# Patient Record
Sex: Female | Born: 1935 | Race: White | Hispanic: No | Smoking: Never smoker
Health system: Southern US, Community
[De-identification: ages and names within clinical notes are randomized; demographics above are authoritative.]

## PROBLEM LIST (undated history)

## (undated) DIAGNOSIS — M1990 Unspecified osteoarthritis, unspecified site: Secondary | ICD-10-CM

## (undated) DIAGNOSIS — G8929 Other chronic pain: Secondary | ICD-10-CM

## (undated) DIAGNOSIS — K5090 Crohn's disease, unspecified, without complications: Secondary | ICD-10-CM

## (undated) DIAGNOSIS — R32 Unspecified urinary incontinence: Secondary | ICD-10-CM

## (undated) DIAGNOSIS — Z5189 Encounter for other specified aftercare: Secondary | ICD-10-CM

## (undated) HISTORY — PX: TOTAL HIP ARTHROPLASTY: SHX124

## (undated) HISTORY — PX: JOINT REPLACEMENT: SHX530

## (undated) HISTORY — PX: APPENDECTOMY: SHX54

---

## 2013-02-21 ENCOUNTER — Emergency Department (HOSPITAL_BASED_OUTPATIENT_CLINIC_OR_DEPARTMENT_OTHER)
Admission: EM | Admit: 2013-02-21 | Discharge: 2013-02-22 | Disposition: A | Discharge status: Home / Self Care | Payer: Medicare Other | Attending: Emergency Medicine | Admitting: Emergency Medicine

## 2013-02-21 ENCOUNTER — Encounter (HOSPITAL_BASED_OUTPATIENT_CLINIC_OR_DEPARTMENT_OTHER): Payer: Self-pay | Admitting: *Deleted

## 2013-02-21 ENCOUNTER — Emergency Department (HOSPITAL_BASED_OUTPATIENT_CLINIC_OR_DEPARTMENT_OTHER): Payer: Medicare Other

## 2013-02-21 DIAGNOSIS — M19019 Primary osteoarthritis, unspecified shoulder: Secondary | ICD-10-CM | POA: Insufficient documentation

## 2013-02-21 DIAGNOSIS — M2550 Pain in unspecified joint: Secondary | ICD-10-CM | POA: Insufficient documentation

## 2013-02-21 DIAGNOSIS — Z87828 Personal history of other (healed) physical injury and trauma: Secondary | ICD-10-CM | POA: Insufficient documentation

## 2013-02-21 DIAGNOSIS — M8700 Idiopathic aseptic necrosis of unspecified bone: Secondary | ICD-10-CM | POA: Insufficient documentation

## 2013-02-21 DIAGNOSIS — M19011 Primary osteoarthritis, right shoulder: Secondary | ICD-10-CM

## 2013-02-21 HISTORY — DX: Other chronic pain: G89.29

## 2013-02-21 HISTORY — DX: Unspecified urinary incontinence: R32

## 2013-02-21 HISTORY — DX: Crohn's disease, unspecified, without complications: K50.90

## 2013-02-21 MED ORDER — KETOROLAC TROMETHAMINE 60 MG/2ML IM SOLN
60.0000 mg | Freq: Once | INTRAMUSCULAR | Status: AC
  Administered 2013-02-22: 60 mg via INTRAMUSCULAR
  Filled 2013-02-21: qty 2

## 2013-02-21 NOTE — ED Provider Notes (Signed)
History     This chart was scribed for Virat Prather Smitty Cords, MD, MD by Smitty Pluck, ED Scribe. The patient was seen in room MH04/MH04 and the patient's care was started at 11:27 PM.  CSN: [id number] Arrival date & time 02/21/13  2311    Chief Complaint  Patient presents with  . Arm Pain    Patient is a 79 y.o. female presenting with shoulder pain. The history is provided by the patient, medical records and a relative. No language interpreter was used.  Shoulder Pain This is a chronic problem. The current episode started more than 1 week ago. The problem occurs constantly. The problem has been rapidly worsening. Pertinent negatives include no chest pain, no abdominal pain, no headaches and no shortness of breath. Associated symptoms comments: Started while dressing when she moved the shoulder and then worse with using of the walker. Exacerbated by: movement and palpation of the left shoulder. Nothing relieves the symptoms. The treatment provided no relief.   HPI Comments: Deborah Butler is a 79 y.o. female who presents to the Emergency Department complaining of constant, moderate left shoulder pain onset tonight while dressing. Family reports that pt has hx of rotator cuff disease and swelling the left shoulder. Per family pt was putting on a shirt and the left shoulder pain started. Pt had Vicodin around 8 PM today without relief. Pt has eaten dinner. Pt denies fever, chills, nausea, vomiting, diarrhea, weakness, cough, SOB and any other pain. Per family pt uses walker for mobility and this also exacerbates the pain   Louis Matte is orthopedist    Past Medical History  Diagnosis Date  . Chronic pain    No past surgical history on file. No family history on file. History  Substance Use Topics  . Smoking status: Not on file  . Smokeless tobacco: Not on file  . Alcohol Use: Not on file   OB History   Grav Para Term Preterm Abortions TAB SAB Ect Mult Living                  Review of Systems  Constitutional: Negative for fever and chills.  Respiratory: Negative for shortness of breath.   Cardiovascular: Negative for chest pain.  Gastrointestinal: Negative for nausea, vomiting and abdominal pain.  Musculoskeletal: Positive for arthralgias.  Neurological: Negative for weakness and headaches.  All other systems reviewed and are negative.    Allergies  Morphine and related  Home Medications  No current outpatient prescriptions on file. There were no vitals taken for this visit.  Physical Exam  Nursing note and vitals reviewed. Constitutional: She is oriented to person, place, and time. She appears well-developed and well-nourished. No distress.  HENT:  Head: Normocephalic and atraumatic.  Eyes: EOM are normal. Pupils are equal, round, and reactive to light.  Neck: Normal range of motion. Neck supple. No tracheal deviation present.  Cardiovascular: Normal rate, regular rhythm and normal heart sounds.   Pulmonary/Chest: Effort normal. No respiratory distress. She has no wheezes.  Abdominal: Soft. Bowel sounds are normal. She exhibits no distension. There is no tenderness. There is no rebound and no guarding.  Musculoskeletal:  Intact radial pulse in the left wrist  Cap refill less than 2 seconds in left hand Left biceps tendon intact  Left    Neurological: She is alert and oriented to person, place, and time.  Skin: Skin is warm and dry.  Psychiatric: She has a normal mood and affect. Her behavior is normal.  ED Course  Procedures (including critical care time) DIAGNOSTIC STUDIES:   COORDINATION OF CARE: 11:34 PM Discussed ED treatment with pt and pt agrees.     Labs Reviewed - No data to display No results found. No diagnosis found.  MDM  Family requests inpatient care for pain management placed 3 calls via Dr. Rolly Salter answering service  Dr. Willette Cluster on call for Dr. Thamas Jaegers returned call, follow up in office no indication for  admission at this time  I personally performed the services described in this documentation, which was scribed in my presence. The recorded information has been reviewed and is accurate.    Jasmine Awe, MD 02/22/13 [id number]

## 2013-02-21 NOTE — ED Notes (Signed)
EMS transport from home- per ems report: chronic pain- c/o increased left shoulder pain after attempting to put on a shirt- pt reports she took a vicodin at 2100

## 2013-02-22 ENCOUNTER — Encounter (HOSPITAL_BASED_OUTPATIENT_CLINIC_OR_DEPARTMENT_OTHER): Payer: Self-pay | Admitting: Emergency Medicine

## 2013-02-22 MED ORDER — FENTANYL CITRATE 0.05 MG/ML IJ SOLN
INTRAMUSCULAR | Status: AC
  Administered 2013-02-22: 25 ug
  Filled 2013-02-22: qty 2

## 2013-02-22 MED ORDER — LIDOCAINE 5 % EX PTCH: 1.0000 | MEDICATED_PATCH | CUTANEOUS | Status: AC

## 2013-02-22 MED ORDER — LIP MEDEX EX OINT
TOPICAL_OINTMENT | CUTANEOUS | Status: AC
  Administered 2013-02-22: 01:00:00
  Filled 2013-02-22: qty 7

## 2013-02-22 MED ORDER — FENTANYL CITRATE 0.05 MG/ML IJ SOLN
25.0000 ug | Freq: Once | INTRAMUSCULAR | Status: AC
Start: 2013-02-22 — End: 2013-02-22
  Administered 2013-02-22: 25 ug via INTRAVENOUS

## 2013-02-22 MED ORDER — FENTANYL CITRATE 0.05 MG/ML IJ SOLN
INTRAMUSCULAR | Status: AC
  Administered 2013-02-22: 01:00:00
  Filled 2013-02-22: qty 2

## 2013-02-22 MED ORDER — OXYCODONE-ACETAMINOPHEN 5-325 MG PO TABS: 1.0000 | ORAL_TABLET | Freq: Four times a day (QID) | ORAL | Status: AC | PRN

## 2013-02-22 NOTE — ED Notes (Signed)
Pt incontinent urine bed linen and adult diaper changed family members at bedside call to [facility] ortho service related to ortho consult and intractable Left shoulder pain. Waiting return call at this time.

## 2013-07-17 ENCOUNTER — Emergency Department (HOSPITAL_BASED_OUTPATIENT_CLINIC_OR_DEPARTMENT_OTHER)
Admission: EM | Admit: 2013-07-17 | Discharge: 2013-07-17 | Disposition: A | Discharge status: Home / Self Care | Payer: Medicare Other | Attending: Emergency Medicine | Admitting: Emergency Medicine

## 2013-07-17 ENCOUNTER — Emergency Department (HOSPITAL_BASED_OUTPATIENT_CLINIC_OR_DEPARTMENT_OTHER): Payer: Medicare Other

## 2013-07-17 ENCOUNTER — Encounter (HOSPITAL_BASED_OUTPATIENT_CLINIC_OR_DEPARTMENT_OTHER): Payer: Self-pay | Admitting: Emergency Medicine

## 2013-07-17 DIAGNOSIS — Y9289 Other specified places as the place of occurrence of the external cause: Secondary | ICD-10-CM | POA: Insufficient documentation

## 2013-07-17 DIAGNOSIS — Z7982 Long term (current) use of aspirin: Secondary | ICD-10-CM | POA: Insufficient documentation

## 2013-07-17 DIAGNOSIS — Y939 Activity, unspecified: Secondary | ICD-10-CM | POA: Insufficient documentation

## 2013-07-17 DIAGNOSIS — K5090 Crohn's disease, unspecified, without complications: Secondary | ICD-10-CM | POA: Insufficient documentation

## 2013-07-17 DIAGNOSIS — Z79899 Other long term (current) drug therapy: Secondary | ICD-10-CM | POA: Insufficient documentation

## 2013-07-17 DIAGNOSIS — G8929 Other chronic pain: Secondary | ICD-10-CM | POA: Insufficient documentation

## 2013-07-17 DIAGNOSIS — M19019 Primary osteoarthritis, unspecified shoulder: Secondary | ICD-10-CM | POA: Insufficient documentation

## 2013-07-17 DIAGNOSIS — X500XXA Overexertion from strenuous movement or load, initial encounter: Secondary | ICD-10-CM | POA: Insufficient documentation

## 2013-07-17 HISTORY — DX: Unspecified osteoarthritis, unspecified site: M19.90

## 2013-07-17 HISTORY — DX: Encounter for other specified aftercare: Z51.89

## 2013-07-17 MED ORDER — FENTANYL CITRATE 0.05 MG/ML IJ SOLN
50.0000 ug | Freq: Once | INTRAMUSCULAR | Status: AC
  Administered 2013-07-17: 50 ug via NASAL
  Filled 2013-07-17: qty 2

## 2013-07-17 MED ORDER — FENTANYL CITRATE 0.05 MG/ML IJ SOLN: 50.0000 ug | Freq: Once | INTRAMUSCULAR | Status: DC

## 2013-07-17 NOTE — ED Notes (Signed)
Pt verbalized pain relief s/p first Fentanyl administration.  Son has been advocating for patient to receive another dose of Fentanyl prior discharge.  However, given patient's age, weight, and level of comfort, if discharge is planned, I don't feel comfortable administering the second dose without further monitoring.  Dr. Bernette Mayers and I discussed this, I discussed this with the son, and we will plan discharge home now and plan for her to take pain medications at home.

## 2013-07-17 NOTE — ED Notes (Signed)
Pt reports she moved her right shoulder and it "popped" and it now very painful- family gave her a hydrocodone at 5pm

## 2013-07-17 NOTE — ED Notes (Signed)
Dr Sheldon at bedside  

## 2013-07-17 NOTE — ED Provider Notes (Signed)
CSN: [id number]     Arrival date & time 07/17/13  1749 History  This chart was scribed for Adriyanna Christians B. Bernette Mayers, MD by Clydene Laming, ED Scribe. This patient was seen in room MH05/MH05 and the patient's care was started at 6:10 PM.   Chief Complaint  Patient presents with  . Shoulder Pain    The history is provided by the patient and a relative. No language interpreter was used.  HPI Comments: Deborah Butler is a 79 y.o. female who presents to the Emergency Department complaining of right shoulder pain. Pt was in the bathroom today when she raised her right arm and heard a "pop".  She states she cannot move the right arm. Her family gave her hydrocodone and applied pain gel to the area. She has a hx of arthritis. Pt has her quarterly appointment this upcoming Monday with an orthopedist to receive her quarterly steroid injection. Pt denies numbness.   Past Medical History  Diagnosis Date  . Chronic pain   . Crohn disease   . Urinary incontinence   . Arthritis   . Blood transfusion without reported diagnosis    Past Surgical History  Procedure Laterality Date  . Joint replacement    . Total hip arthroplasty    . Appendectomy     No family history on file. History  Substance Use Topics  . Smoking status: Never Smoker   . Smokeless tobacco: Never Used  . Alcohol Use: No   OB History   Grav Para Term Preterm Abortions TAB SAB Ect Mult Living                 Review of Systems  Musculoskeletal: Positive for arthralgias.  All other systems reviewed and are negative.    Allergies  Morphine and related  Home Medications   Current Outpatient Rx  Name  Route  Sig  Dispense  Refill  . acetaminophen (TYLENOL) 500 MG tablet   Oral   Take 500 mg by mouth every 6 (six) hours as needed for pain (2 tabs every 8 hrs as needed).         Marland Kitchen aspirin 81 MG tablet   Oral   Take 81 mg by mouth daily.         . calcium carbonate (TUMS - DOSED IN MG ELEMENTAL CALCIUM) 500 MG chewable  tablet   Oral   Chew 1 tablet by mouth daily.         . cloNIDine (CATAPRES) 0.1 MG tablet   Oral   Take 0.1 mg by mouth 2 (two) times daily.         . furosemide (LASIX) 40 MG tablet   Oral   Take 40 mg by mouth daily.         Marland Kitchen gabapentin (NEURONTIN) 600 MG tablet   Oral   Take 600 mg by mouth 2 (two) times daily.         . hydrochlorothiazide (HYDRODIURIL) 12.5 MG tablet   Oral   Take 12.5 mg by mouth daily.         Marland Kitchen HYDROcodone-acetaminophen (NORCO/VICODIN) 5-325 MG per tablet   Oral   Take 1 tablet by mouth every 6 (six) hours as needed for moderate pain.         . Mesalamine (DELZICOL PO)   Oral   Take 800 mg by mouth 3 (three) times daily.         Marland Kitchen oxyCODONE-acetaminophen (PERCOCET) 5-325 MG per tablet   Oral  Take 1 tablet by mouth every 6 (six) hours as needed for pain.   10 tablet   0   . potassium chloride (K-DUR) 10 MEQ tablet   Oral   Take 10 mEq by mouth 2 (two) times daily.         . ramipril (ALTACE) 10 MG tablet   Oral   Take 10 mg by mouth daily.         . traMADol (ULTRAM) 50 MG tablet   Oral   Take 50 mg by mouth every 6 (six) hours as needed for pain (1 - 2 tabs every 4-6 hrs).         . lidocaine (LIDODERM) 5 %   Transdermal   Place 1 patch onto the skin daily. Remove & Discard patch within 12 hours or as directed by MD   6 patch   0   . Mirabegron (MYRBETRIQ PO)   Oral   Take 25 mg by mouth 3 (three) times a week.          Triage Vitals:BP 200/82  Pulse 78  Temp(Src) 97.8 F (36.6 C) (Oral)  Resp 18  Ht 5' (1.524 m)  Wt 118 lb (53.524 kg)  BMI 23.05 kg/m2  SpO2 98% Physical Exam  Nursing note and vitals reviewed. Constitutional: She is oriented to person, place, and time. She appears well-developed and well-nourished.  HENT:  Head: Normocephalic and atraumatic.  Eyes: EOM are normal. Pupils are equal, round, and reactive to light.  Neck: Normal range of motion. Neck supple.  Cardiovascular: Normal  rate, normal heart sounds and intact distal pulses.   Pulmonary/Chest: Effort normal and breath sounds normal.  Abdominal: Bowel sounds are normal. She exhibits no distension. There is no tenderness.  Musculoskeletal: She exhibits tenderness (right shoulder). She exhibits no edema.  rom limited due to pain neurovascular intact   Neurological: She is alert and oriented to person, place, and time. She has normal strength. No cranial nerve deficit or sensory deficit.  Skin: Skin is warm and dry. No rash noted.  Psychiatric: She has a normal mood and affect.    ED Course  Procedures (including critical care time) DIAGNOSTIC STUDIES: Oxygen Saturation is 98% on RA, normal by my interpretation.    COORDINATION OF CARE: 6:12 PM- Discussed treatment plan with pt at bedside. Pt verbalized understanding and agreement with plan.   Labs Review Labs Reviewed - No data to display Imaging Review No results found.  EKG Interpretation   None       MDM   1. Osteoarthritis, shoulder, right     Intranasal fentanyl given with minimal improvement. Will place in sling she brought from home and reposition arm. Plan an additional dose of fentanyl if needed. She has pain medications she can take at home and Ortho appt already scheduled in 2 days.   I personally performed the services described in this documentation, which was scribed in my presence. The recorded information has been reviewed and is accurate.       Hermilo Dutter B. Bernette Mayers, MD 07/17/13 1944

## 2013-10-10 ENCOUNTER — Emergency Department (HOSPITAL_BASED_OUTPATIENT_CLINIC_OR_DEPARTMENT_OTHER)
Admission: EM | Admit: 2013-10-10 | Discharge: 2013-10-10 | Disposition: A | Discharge status: Home / Self Care | Payer: Medicare Other | Attending: Emergency Medicine | Admitting: Emergency Medicine

## 2013-10-10 ENCOUNTER — Encounter (HOSPITAL_BASED_OUTPATIENT_CLINIC_OR_DEPARTMENT_OTHER): Payer: Self-pay | Admitting: Emergency Medicine

## 2013-10-10 ENCOUNTER — Emergency Department (HOSPITAL_BASED_OUTPATIENT_CLINIC_OR_DEPARTMENT_OTHER): Payer: Medicare Other

## 2013-10-10 DIAGNOSIS — M79609 Pain in unspecified limb: Secondary | ICD-10-CM | POA: Insufficient documentation

## 2013-10-10 DIAGNOSIS — Z87448 Personal history of other diseases of urinary system: Secondary | ICD-10-CM | POA: Insufficient documentation

## 2013-10-10 DIAGNOSIS — Z79899 Other long term (current) drug therapy: Secondary | ICD-10-CM | POA: Insufficient documentation

## 2013-10-10 DIAGNOSIS — M129 Arthropathy, unspecified: Secondary | ICD-10-CM | POA: Insufficient documentation

## 2013-10-10 DIAGNOSIS — Z8719 Personal history of other diseases of the digestive system: Secondary | ICD-10-CM | POA: Insufficient documentation

## 2013-10-10 DIAGNOSIS — M25511 Pain in right shoulder: Secondary | ICD-10-CM

## 2013-10-10 DIAGNOSIS — M25519 Pain in unspecified shoulder: Secondary | ICD-10-CM | POA: Insufficient documentation

## 2013-10-10 DIAGNOSIS — Z7982 Long term (current) use of aspirin: Secondary | ICD-10-CM | POA: Insufficient documentation

## 2013-10-10 DIAGNOSIS — R011 Cardiac murmur, unspecified: Secondary | ICD-10-CM | POA: Insufficient documentation

## 2013-10-10 DIAGNOSIS — G8929 Other chronic pain: Secondary | ICD-10-CM | POA: Insufficient documentation

## 2013-10-10 MED ORDER — FENTANYL CITRATE 0.05 MG/ML IJ SOLN
50.0000 ug | Freq: Once | INTRAMUSCULAR | Status: AC
  Administered 2013-10-10: 50 ug via NASAL
  Filled 2013-10-10: qty 2

## 2013-10-10 NOTE — ED Notes (Signed)
Patient routinely goes for steroid injections for ' both shoulders are mush' per patient's son.  Has a lot of chronic orthopedic pain.  Reports right arm/shoulder pain today.  Vicodin is not controlling her pain.  Denies fall, recent injury.

## 2013-10-10 NOTE — ED Provider Notes (Signed)
CSN: [id number]     Arrival date & time 10/10/13  1428 History   First MD Initiated Contact with Patient 10/10/13 1751     Chief Complaint  Patient presents with  . Arm Pain     (Consider location/radiation/quality/duration/timing/severity/associated sxs/prior Treatment) Patient is a 80 y.o. female presenting with arm pain. The history is provided by a relative.  Arm Pain This is a chronic problem. The problem occurs constantly.   Deborah Butler is a 80 y.o. female who presents to the ED with her son for bilateral chronic right shoulder and arm pain. She is taking Vicodin for pain without relief. She goes for steroid shots in the shoulder. Denies recent falls or injury to the arm. However, her son states that she was in the bathroom and when she came out was complaining about severe pain int he right shoulder and upper arm. Patient lives in her own home but has someone with her 24/7. It is almost time for her to go the the orthopedic doctor for her steroid injection and occasionally the pain will return before time to go.   Past Medical History  Diagnosis Date  . Chronic pain   . Crohn disease   . Urinary incontinence   . Arthritis   . Blood transfusion without reported diagnosis    Past Surgical History  Procedure Laterality Date  . Joint replacement    . Total hip arthroplasty    . Appendectomy     No family history on file. History  Substance Use Topics  . Smoking status: Never Smoker   . Smokeless tobacco: Never Used  . Alcohol Use: No   OB History   Grav Para Term Preterm Abortions TAB SAB Ect Mult Living                 Review of Systems Negative except as stated in HPI   Allergies  Morphine and related  Home Medications   Current Outpatient Rx  Name  Route  Sig  Dispense  Refill  . acetaminophen (TYLENOL) 500 MG tablet   Oral   Take 500 mg by mouth every 6 (six) hours as needed for pain (2 tabs every 8 hrs as needed).         Marland Kitchen aspirin 81 MG tablet  Oral   Take 81 mg by mouth daily.         . calcium carbonate (TUMS - DOSED IN MG ELEMENTAL CALCIUM) 500 MG chewable tablet   Oral   Chew 1 tablet by mouth daily.         . cloNIDine (CATAPRES) 0.1 MG tablet   Oral   Take 0.1 mg by mouth 2 (two) times daily.         . furosemide (LASIX) 40 MG tablet   Oral   Take 40 mg by mouth daily.         Marland Kitchen gabapentin (NEURONTIN) 600 MG tablet   Oral   Take 600 mg by mouth 2 (two) times daily.         . hydrochlorothiazide (HYDRODIURIL) 12.5 MG tablet   Oral   Take 12.5 mg by mouth daily.         Marland Kitchen HYDROcodone-acetaminophen (NORCO/VICODIN) 5-325 MG per tablet   Oral   Take 1 tablet by mouth every 6 (six) hours as needed for moderate pain.         Marland Kitchen lidocaine (LIDODERM) 5 %   Transdermal   Place 1 patch onto the skin daily.  Remove & Discard patch within 12 hours or as directed by MD   6 patch   0   . Mesalamine (DELZICOL PO)   Oral   Take 800 mg by mouth 3 (three) times daily.         . Mirabegron (MYRBETRIQ PO)   Oral   Take 25 mg by mouth 3 (three) times a week.         Marland Kitchen. oxyCODONE-acetaminophen (PERCOCET) 5-325 MG per tablet   Oral   Take 1 tablet by mouth every 6 (six) hours as needed for pain.   10 tablet   0   . potassium chloride (K-DUR) 10 MEQ tablet   Oral   Take 10 mEq by mouth 2 (two) times daily.         . ramipril (ALTACE) 10 MG tablet   Oral   Take 10 mg by mouth daily.         . traMADol (ULTRAM) 50 MG tablet   Oral   Take 50 mg by mouth every 6 (six) hours as needed for pain (1 - 2 tabs every 4-6 hrs).          BP 184/94  Pulse 76  Temp(Src) 97.7 F (36.5 C) (Oral)  Resp 18  Ht 5' (1.524 m)  Wt 115 lb (52.164 kg)  BMI 22.46 kg/m2  SpO2 95% Physical Exam  Nursing note and vitals reviewed. Constitutional: She is oriented to person, place, and time. She appears well-developed and well-nourished. No distress.  HENT:  Head: Normocephalic and atraumatic.  Eyes: EOM are  normal.  Neck: Normal range of motion. Neck supple.  Cardiovascular: Normal rate.   Murmur heard. Pulmonary/Chest: Effort normal. She has no wheezes.  Abdominal: Soft. There is no tenderness.  Musculoskeletal:       Right shoulder: She exhibits decreased range of motion, tenderness and crepitus. She exhibits no deformity, no laceration, normal pulse and normal strength.  Pain with palpation anterior aspect of the right shoulder with pain radiating to the upper arm. Radial pulses equal, adequate circulation, good touch sensation.   Neurological: She is alert and oriented to person, place, and time. No cranial nerve deficit.  Skin: Skin is warm and dry.  Psychiatric: She has a normal mood and affect. Her behavior is normal.   Dg Shoulder Right  10/10/2013   CLINICAL DATA:  Chronic shoulder pain.  EXAM: RIGHT SHOULDER - 2+ VIEW  COMPARISON:  DG SHOULDER *R* dated 07/17/2013  FINDINGS: Chronic destructive changes appreciated involving the right shoulder. There is no evidence of acute osseous abnormalities. When compared to previous study these findings have increased in severity. Stable chronic changes within distal clavicle. The bones are osteopenic.  IMPRESSION: Chronic advanced osteoarthritic changes involving the right shoulder. No evidence of acute osseous abnormalities. Stable chronic changes within the distal clavicle.   Electronically Signed   By: Salome HolmesHector  Cooper M.D.   On: 10/10/2013 19:32    ED Course: Dr. Deretha EmoryZackowski in to examine the patient.  Procedures MDM   Gave patient nasal Fentanyl 50 mcg and she is pain free. She continues to be alert. I have reviewed this patient's vital signs, nurses notes, appropriate labs and imaging.  I have discussed findings and plan of care with the patient's son and he voices understanding.        Connelly SpringsHope M Cherise Fedder, [place]NP 10/11/13 2136

## 2013-10-10 NOTE — ED Provider Notes (Signed)
Medical screening examination/treatment/procedure(s) were conducted as a shared visit with non-physician practitioner(s) and myself.  I personally evaluated the patient during the encounter.  EKG Interpretation   None      No results found for this or any previous visit. Dg Shoulder Right  10/10/2013   CLINICAL DATA:  Chronic shoulder pain.  EXAM: RIGHT SHOULDER - 2+ VIEW  COMPARISON:  DG SHOULDER *R* dated 07/17/2013  FINDINGS: Chronic destructive changes appreciated involving the right shoulder. There is no evidence of acute osseous abnormalities. When compared to previous study these findings have increased in severity. Stable chronic changes within distal clavicle. The bones are osteopenic.  IMPRESSION: Chronic advanced osteoarthritic changes involving the right shoulder. No evidence of acute osseous abnormalities. Stable chronic changes within the distal clavicle.   Electronically Signed   By: Salome HolmesHector  Cooper M.D.   On: 10/10/2013 19:32    The patient will chronic changes to the right shoulder no acute injury. Patient improved here with some intranasal fentanyl. Patient does have record Dr. to followup they are using injections into the shoulder to help her with pain control. Patient stable for discharge home. Patient seen by me. Family was concerned that since the shoulder started bothering her she came out of the bathroom that perhaps there was acute injury. Apparently no evidence of acute injury here today. Seems to be consistent with her chronic shoulder pain.  Shelda JakesScott W. Oseas Detty, MD 10/10/13 [phone]

## 2014-12-25 DEATH — deceased

## 2015-07-23 IMAGING — CR DG SHOULDER 2+V*R*
3 series · 3 of 3 positions shown · non-contrast
Comparison: DG SHOULDER *R* dated 07/17/2013

CLINICAL DATA: Chronic shoulder pain.

EXAM:
RIGHT SHOULDER - 2+ VIEW

[view not recorded (1 of 3)]
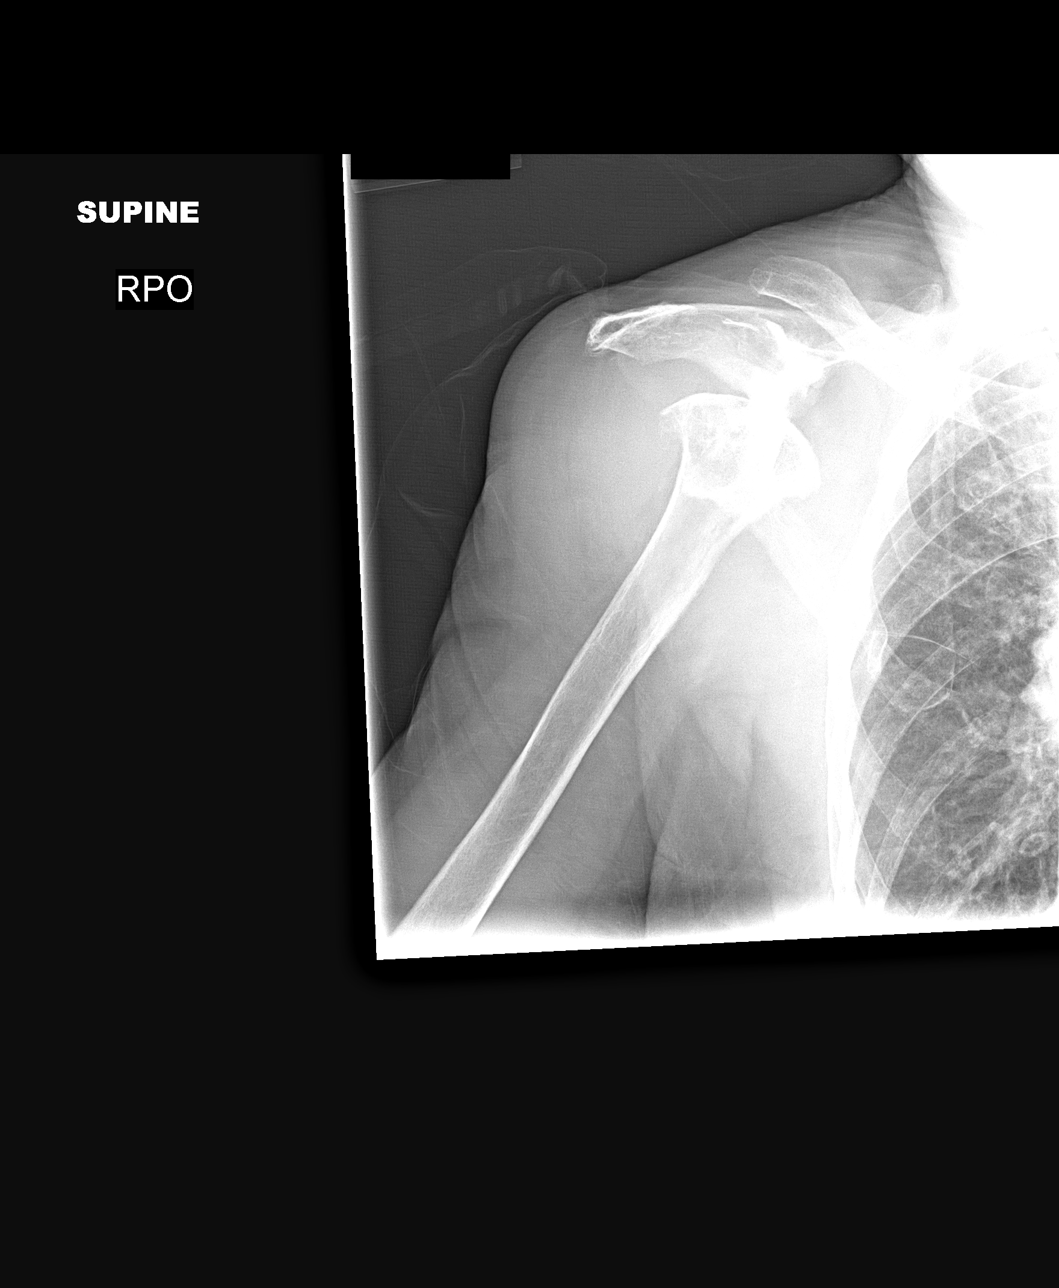

[view not recorded (2 of 3)]
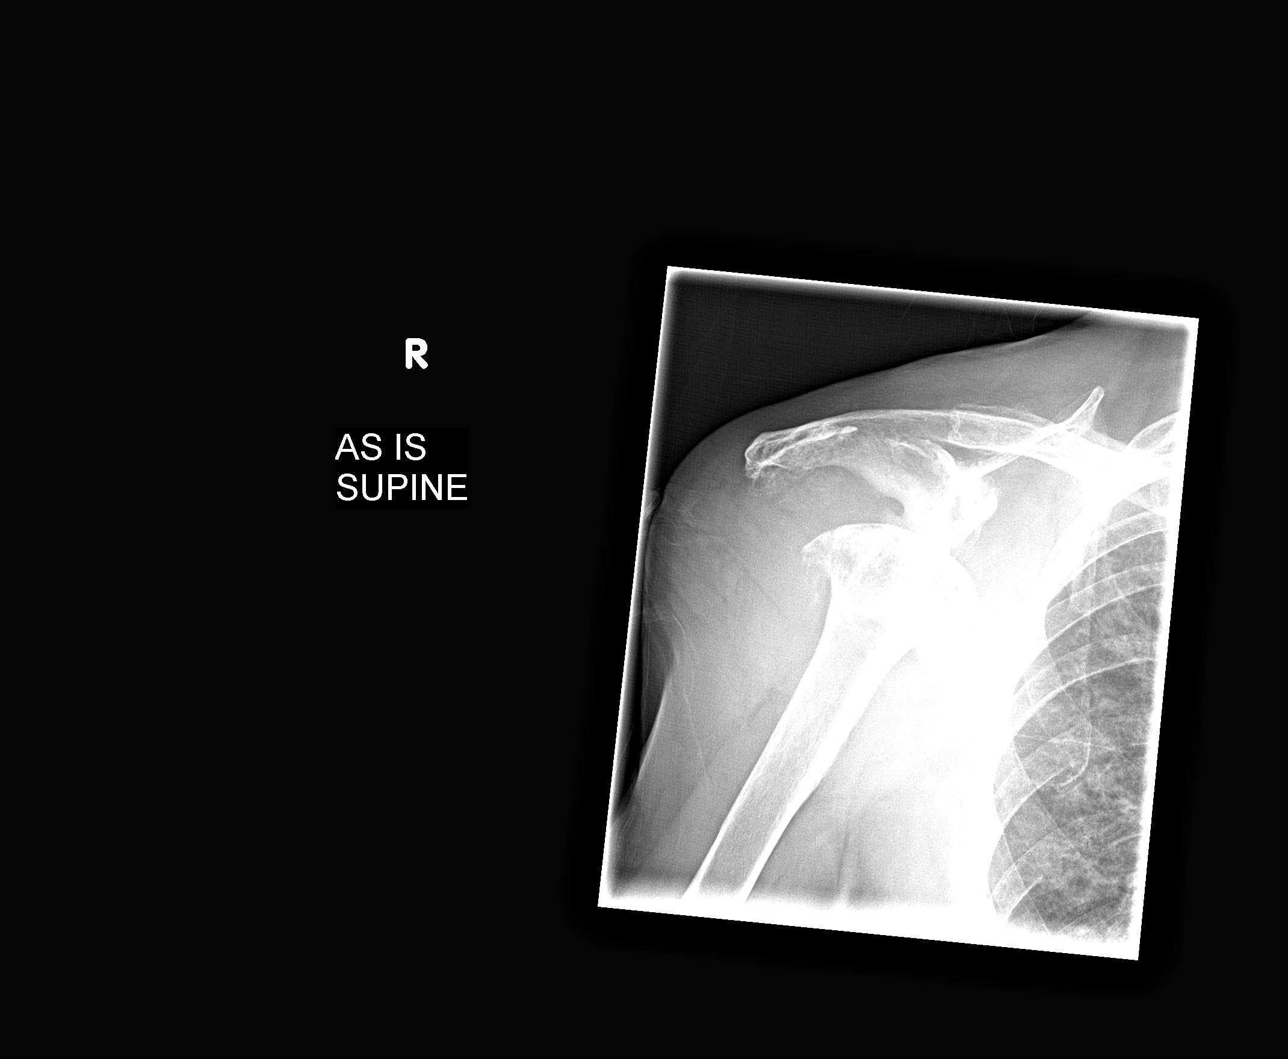

[view not recorded (3 of 3)]
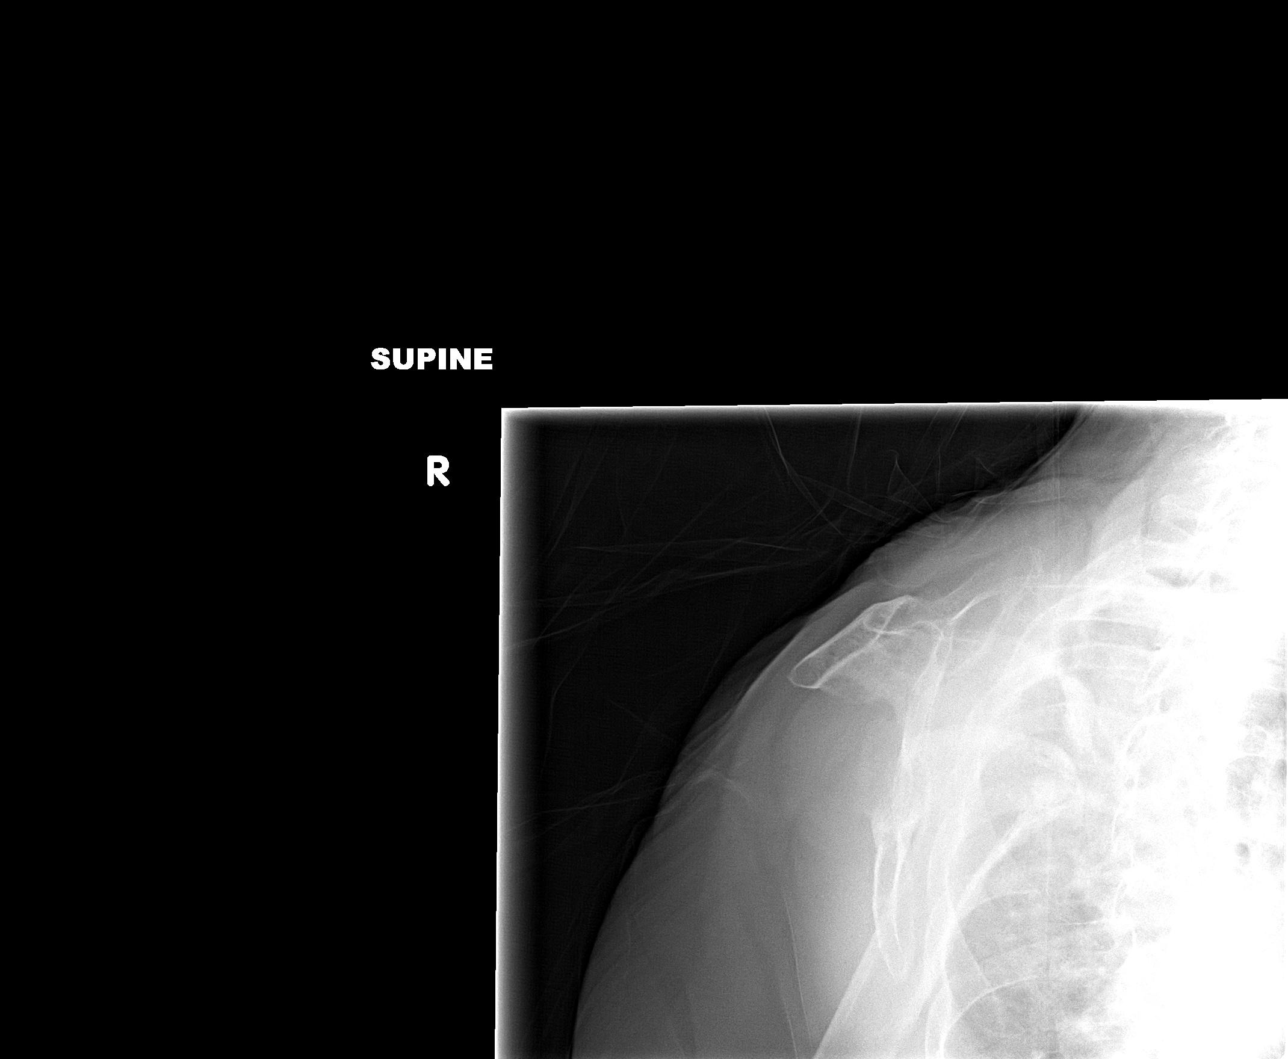

[3 of 3 positions shown; findings below may reference images not displayed]

FINDINGS: Chronic destructive changes appreciated involving the right
shoulder. There is no evidence of acute osseous abnormalities. When
compared to previous study these findings have increased in
severity. Stable chronic changes within distal clavicle. The bones
are osteopenic.
IMPRESSION: Chronic advanced osteoarthritic changes involving the right
shoulder. No evidence of acute osseous abnormalities. Stable chronic
changes within the distal clavicle.
